# Patient Record
Sex: Female | Born: 1975 | Hispanic: Yes | Marital: Married | Smoking: Never smoker
Health system: Southern US, Community
[De-identification: ages and names within clinical notes are randomized; demographics above are authoritative.]

---

## 2015-08-21 ENCOUNTER — Ambulatory Visit
Admission: RE | Admit: 2015-08-21 | Discharge: 2015-08-21 | Disposition: A | Discharge status: Home / Self Care | Payer: No Typology Code available for payment source | Source: Ambulatory Visit | Attending: Surgery | Admitting: Surgery

## 2015-08-21 ENCOUNTER — Other Ambulatory Visit: Payer: Self-pay | Admitting: Surgery

## 2015-08-21 DIAGNOSIS — N63 Unspecified lump in unspecified breast: Secondary | ICD-10-CM

## 2015-08-24 ENCOUNTER — Encounter (INDEPENDENT_AMBULATORY_CARE_PROVIDER_SITE_OTHER): Payer: Self-pay

## 2015-08-24 ENCOUNTER — Encounter: Payer: Self-pay | Admitting: *Deleted

## 2015-08-24 ENCOUNTER — Ambulatory Visit: Payer: No Typology Code available for payment source | Attending: Internal Medicine | Admitting: *Deleted

## 2015-08-24 VITALS — BP 142/94 | HR 69 | Temp 98.4°F | Ht 60.63 in | Wt 140.4 lb

## 2015-08-24 DIAGNOSIS — N63 Unspecified lump in unspecified breast: Secondary | ICD-10-CM

## 2015-08-24 NOTE — Progress Notes (Signed)
Subjective:     Patient ID: Karen Peck, female   DOB: [birth year], 40 y.o.   MRN: [id number]  HPI   Review of Systems     Objective:   Physical Exam  Pulmonary/Chest: Right breast exhibits inverted nipple and mass. Right breast exhibits no nipple discharge, no skin change and no tenderness. Left breast exhibits inverted nipple and mass. Left breast exhibits no nipple discharge, no skin change and no tenderness. Breasts are asymmetrical.         Assessment:    40 year old Hispanic patient referred to [facility] by Dr. Renda Rolls for financial assistance for further evaluation of a right breast mass.  Kandis Cocking, the interpreter is present during the interview and exam.  Patient states she experienced pain in her right breast prior to her menstrual cycle on 08/04/15.  States the next day she noticed the right breast was firm.  States she went to her doctor, and was started on an antibiotic, and then sent to see Dr. Renda Rolls on 08/20/15.  States Dr. Katrinka Blazing did a needle aspiration.  Those results are not available today. Patient had a birads 4 mammogram on 08/21/15.  On clinical breast exam, I can visualize a large right breast mass at 1-3:00.  There is yellowish bruising noted.  There is also noted a small raised nodule at 1:00 left breast in the areola.  On palpation the right breast mass measures approximately 5X5 cm.   There is lymphadenopathy noted in the right axilla.  Palpable in the left breast is an approximate 0.5 cm firm nodule at 1:00 in the areola. Taught self breast awareness.  Patient has been screened for eligibility.  She does not have any insurance, Medicare or Medicaid.  She also meets financial eligibility.  Hand-out given on the Affordable Care Act.    Plan:     Called the [facility] care center to schedule patient for her biopsy.  Talked to Madelynn Done who was unable to schedule her today.  She is to call the patient and schedule her biopsy.  Patient informed to  call me if she has not heard from the Breast Center by tomorrow.  She is agreeable.  Will follow up per [facility] protocol.

## 2015-08-27 ENCOUNTER — Other Ambulatory Visit: Payer: Self-pay | Admitting: Surgery

## 2015-08-27 DIAGNOSIS — N6310 Unspecified lump in the right breast, unspecified quadrant: Secondary | ICD-10-CM

## 2015-09-01 ENCOUNTER — Ambulatory Visit
Admission: RE | Admit: 2015-09-01 | Discharge: 2015-09-01 | Disposition: A | Discharge status: Home / Self Care | Payer: Self-pay | Source: Ambulatory Visit | Attending: Surgery | Admitting: Surgery

## 2015-09-01 DIAGNOSIS — N63 Unspecified lump in unspecified breast: Secondary | ICD-10-CM

## 2015-09-01 DIAGNOSIS — N6310 Unspecified lump in the right breast, unspecified quadrant: Secondary | ICD-10-CM

## 2015-09-01 HISTORY — PX: BREAST BIOPSY: SHX20

## 2015-09-02 LAB — SURGICAL PATHOLOGY

## 2015-09-07 ENCOUNTER — Encounter: Payer: Self-pay | Admitting: *Deleted

## 2015-09-07 NOTE — Progress Notes (Signed)
Talked with Dr. Michaelle CopasSmith's nurse today to confirm that his office was referring the patient to infectious disease.  She states Dr. Katrinka BlazingSmith has already talked with Dr. Sampson GoonFitzgerald and their office was taking care of it.  Will close [facility] case since biopsy was benign.  [facility] to [place] [facility].

## 2015-11-09 ENCOUNTER — Other Ambulatory Visit: Payer: Self-pay | Admitting: Surgery

## 2015-11-09 DIAGNOSIS — N61 Mastitis without abscess: Secondary | ICD-10-CM

## 2015-11-23 ENCOUNTER — Ambulatory Visit
Admission: RE | Admit: 2015-11-23 | Discharge: 2015-11-23 | Disposition: A | Discharge status: Home / Self Care | Payer: No Typology Code available for payment source | Source: Ambulatory Visit | Attending: Surgery | Admitting: Surgery

## 2015-11-23 DIAGNOSIS — N61 Mastitis without abscess: Secondary | ICD-10-CM

## 2019-01-01 ENCOUNTER — Other Ambulatory Visit: Payer: Self-pay | Admitting: General Surgery

## 2019-01-01 DIAGNOSIS — N6310 Unspecified lump in the right breast, unspecified quadrant: Secondary | ICD-10-CM

## 2019-01-08 ENCOUNTER — Ambulatory Visit
Admission: RE | Admit: 2019-01-08 | Discharge: 2019-01-08 | Disposition: A | Discharge status: Home / Self Care | Payer: Self-pay | Source: Ambulatory Visit | Attending: General Surgery | Admitting: General Surgery

## 2019-01-08 ENCOUNTER — Encounter: Payer: Self-pay | Admitting: Radiology

## 2019-01-08 ENCOUNTER — Other Ambulatory Visit: Payer: Self-pay | Admitting: General Surgery

## 2019-01-08 ENCOUNTER — Ambulatory Visit
Admission: RE | Admit: 2019-01-08 | Discharge: 2019-01-08 | Disposition: A | Discharge status: Home / Self Care | Payer: No Typology Code available for payment source | Source: Ambulatory Visit | Attending: General Surgery | Admitting: General Surgery

## 2019-01-08 ENCOUNTER — Other Ambulatory Visit: Payer: Self-pay

## 2019-01-08 DIAGNOSIS — N6310 Unspecified lump in the right breast, unspecified quadrant: Secondary | ICD-10-CM

## 2019-01-09 ENCOUNTER — Telehealth: Payer: Self-pay | Admitting: *Deleted

## 2019-01-10 ENCOUNTER — Other Ambulatory Visit: Payer: Self-pay | Admitting: General Surgery

## 2019-01-10 DIAGNOSIS — N611 Abscess of the breast and nipple: Secondary | ICD-10-CM

## 2019-01-10 DIAGNOSIS — R928 Other abnormal and inconclusive findings on diagnostic imaging of breast: Secondary | ICD-10-CM

## 2019-01-11 LAB — BODY FLUID CULTURE

## 2019-01-15 ENCOUNTER — Ambulatory Visit
Admission: RE | Admit: 2019-01-15 | Discharge: 2019-01-15 | Disposition: A | Discharge status: Home / Self Care | Payer: No Typology Code available for payment source | Source: Ambulatory Visit | Attending: General Surgery | Admitting: General Surgery

## 2019-01-15 DIAGNOSIS — N611 Abscess of the breast and nipple: Secondary | ICD-10-CM

## 2019-01-15 DIAGNOSIS — R928 Other abnormal and inconclusive findings on diagnostic imaging of breast: Secondary | ICD-10-CM

## 2019-01-21 ENCOUNTER — Other Ambulatory Visit: Payer: Self-pay | Admitting: General Surgery

## 2019-01-21 DIAGNOSIS — N611 Abscess of the breast and nipple: Secondary | ICD-10-CM

## 2019-01-23 ENCOUNTER — Other Ambulatory Visit: Payer: Self-pay | Admitting: General Surgery

## 2019-01-23 ENCOUNTER — Other Ambulatory Visit: Payer: Self-pay

## 2019-01-23 ENCOUNTER — Ambulatory Visit
Admission: RE | Admit: 2019-01-23 | Discharge: 2019-01-23 | Disposition: A | Discharge status: Home / Self Care | Payer: No Typology Code available for payment source | Source: Ambulatory Visit | Attending: General Surgery | Admitting: General Surgery

## 2019-01-23 DIAGNOSIS — R928 Other abnormal and inconclusive findings on diagnostic imaging of breast: Secondary | ICD-10-CM | POA: Insufficient documentation

## 2019-01-23 DIAGNOSIS — N611 Abscess of the breast and nipple: Secondary | ICD-10-CM

## 2019-01-28 ENCOUNTER — Other Ambulatory Visit: Payer: Self-pay | Admitting: General Surgery

## 2019-01-28 DIAGNOSIS — N611 Abscess of the breast and nipple: Secondary | ICD-10-CM

## 2019-02-01 ENCOUNTER — Ambulatory Visit
Admission: RE | Admit: 2019-02-01 | Discharge: 2019-02-01 | Disposition: A | Discharge status: Home / Self Care | Payer: No Typology Code available for payment source | Source: Ambulatory Visit | Attending: General Surgery | Admitting: General Surgery

## 2019-02-01 DIAGNOSIS — N611 Abscess of the breast and nipple: Secondary | ICD-10-CM | POA: Insufficient documentation

## 2019-02-05 ENCOUNTER — Other Ambulatory Visit: Payer: Self-pay | Admitting: General Surgery

## 2019-02-05 DIAGNOSIS — N6310 Unspecified lump in the right breast, unspecified quadrant: Secondary | ICD-10-CM

## 2019-02-05 DIAGNOSIS — R928 Other abnormal and inconclusive findings on diagnostic imaging of breast: Secondary | ICD-10-CM

## 2019-02-12 ENCOUNTER — Ambulatory Visit
Admission: RE | Admit: 2019-02-12 | Discharge: 2019-02-12 | Disposition: A | Discharge status: Home / Self Care | Payer: Self-pay | Source: Ambulatory Visit | Attending: General Surgery | Admitting: General Surgery

## 2019-02-12 DIAGNOSIS — R928 Other abnormal and inconclusive findings on diagnostic imaging of breast: Secondary | ICD-10-CM

## 2019-02-12 DIAGNOSIS — N6310 Unspecified lump in the right breast, unspecified quadrant: Secondary | ICD-10-CM

## 2019-02-12 HISTORY — PX: BREAST BIOPSY: SHX20

## 2019-02-13 LAB — SURGICAL PATHOLOGY

## 2019-03-06 ENCOUNTER — Ambulatory Visit: Payer: Self-pay | Admitting: General Surgery

## 2019-03-06 NOTE — H&P (View-Only) (Signed)
HISTORY OF PRESENT ILLNESS:    Karen Peck is a 43 y.o.female patient who comes for evaluation of persistent palpable mass in the right breast.  Patient has a palpable mass for the last few months.  Mammogram and ultrasound was done showing no right breast abscess.  This was drained percutaneously multiple times without significant resolution.  She was treated with multiple courses of antibiotic therapy.  The infection itself has been under control but the palpable mass with pain has been persistent.  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  The patient reported the pain is localized to the palpable mass.  There is no pain radiation.  Aggravating factor is applying pressure to the area.  There is no alleviating factor.  There has been no fever or chills.  Is no nipple discharge.  Patient denies any family history of breast cancer.      PAST MEDICAL HISTORY:  History reviewed. No pertinent past medical history.      PAST SURGICAL HISTORY:   Past Surgical History:  Procedure Laterality Date  . CESAREAN SECTION  09/18/2006         MEDICATIONS:  No outpatient encounter medications on file as of 03/06/2019.   No facility-administered encounter medications on file as of 03/06/2019.      ALLERGIES:   Patient has no known allergies.   SOCIAL HISTORY:  Social History   Socioeconomic History  . Marital status: Married    Spouse name: Not on file  . Number of children: Not on file  . Years of education: Not on file  . Highest education level: Not on file  Occupational History  . Not on file  Social Needs  . Financial resource strain: Not on file  . Food insecurity    Worry: Not on file    Inability: Not on file  . Transportation needs    Medical: Not on file    Non-medical: Not on file  Tobacco Use  . Smoking status: Never Smoker  . Smokeless tobacco: Never Used  Substance and Sexual Activity  . Alcohol use: No   . Drug use: No  . Sexual activity: Defer  Other Topics Concern  . Not on file  Social History Narrative  . Not on file    FAMILY HISTORY:  Family History  Problem Relation Age of Onset  . No Known Problems Mother   . No Known Problems Father   . No Known Problems Sister   . No Known Problems Brother      GENERAL REVIEW OF SYSTEMS:   General ROS: negative for - chills, fatigue, fever, weight gain or weight loss Allergy and Immunology ROS: negative for - hives  Hematological and Lymphatic ROS: negative for - bleeding problems or bruising, negative for palpable nodes Endocrine ROS: negative for - heat or cold intolerance, hair changes Respiratory ROS: negative for - cough, shortness of breath or wheezing Cardiovascular ROS: no chest pain or palpitations GI ROS: negative for nausea, vomiting, abdominal pain, diarrhea, constipation Musculoskeletal ROS: negative for - joint swelling or muscle pain Neurological ROS: negative for - confusion, syncope Dermatological ROS: negative for pruritus and rash  PHYSICAL EXAM:  Vitals:   03/06/19 0832  BP: 126/82  Pulse: 71  .  Ht:152.4 cm (5') Wt:63.5 kg (140 lb) BSA:Body surface area is 1.64 meters squared. Body mass index is 27.34 kg/m..   GENERAL: Alert, active, oriented x3  HEENT: Pupils equal reactive to light.   Extraocular movements are intact. Sclera clear. Palpebral conjunctiva normal red color.Pharynx clear.  NECK: Supple with no palpable mass and no adenopathy.  LUNGS: Sound clear with no rales rhonchi or wheezes.  HEART: Regular rhythm S1 and S2 without murmur.  BREAST: There is a palpable mass at the 10 o'clock position of the right breast.  Mass is tender to palpation.  There is no axillary adenopathy.  There is no skin changes.  There is no nipple retraction.  ABDOMEN: Soft and depressible, nontender with no palpable mass, no hepatomegaly.   EXTREMITIES: Well-developed well-nourished symmetrical with no dependent  edema.  NEUROLOGICAL: Awake alert oriented, facial expression symmetrical, moving all extremities.      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 -He has been treated with multiple percutaneous drainage antibiotic courses. -Infection has been well controlled but a persistent mass is tender to palpation. -I discussed with the patient the alternative of excisional biopsy of the mass due to the persistent pain and recurring infections. -Patient agreed to proceed.  Benefit and risk of the surgery were discussed.  I discussed with the patient the risk of surgery including bleeding, infection, seroma, hematoma, pain, swelling, scarring and cosmetic issues.  Patient understood and agreed to proceed.          PLAN:  1. Excision of right breast palpable mass (19120) 2. Avoid taking aspirin 5 days before surgery 3. Contact us if has any question or concern.  Patient and her husband verbalized understanding, all questions were answered, and were agreeable with the plan outlined above.   Carolan Shiver, MD  Electronically signed by Carolan Shiver, MD

## 2019-03-06 NOTE — H&P (Signed)
HISTORY OF PRESENT ILLNESS:    [name] is a 44 y.o.female patient who comes for evaluation of persistent palpable mass in the right breast.  Patient has a palpable mass for the last few months.  Mammogram and ultrasound was done showing no right breast abscess.  This was drained percutaneously multiple times without significant resolution.  She was treated with multiple courses of antibiotic therapy.  The infection itself has been under control but the palpable mass with pain has been persistent.  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  The patient reported the pain is localized to the palpable mass.  There is no pain radiation.  Aggravating factor is applying pressure to the area.  There is no alleviating factor.  There has been no fever or chills.  Is no nipple discharge.  Patient denies any family history of breast cancer.      PAST MEDICAL HISTORY:  History reviewed. No pertinent past medical history.      PAST SURGICAL HISTORY:   Past Surgical History:  Procedure Laterality Date  . CESAREAN SECTION  09/18/2006         MEDICATIONS:  No outpatient encounter medications on file as of 03/06/2019.   No facility-administered encounter medications on file as of 03/06/2019.      ALLERGIES:   Patient has no known allergies.   SOCIAL HISTORY:  Social History   Socioeconomic History  . Marital status: Married    Spouse name: Not on file  . Number of children: Not on file  . Years of education: Not on file  . Highest education level: Not on file  Occupational History  . Not on file  Social Needs  . Financial resource strain: Not on file  . Food insecurity    Worry: Not on file    Inability: Not on file  . Transportation needs    Medical: Not on file    Non-medical: Not on file  Tobacco Use  . Smoking status: Never Smoker  . Smokeless tobacco: Never Used  Substance and Sexual Activity  . Alcohol use: No   . Drug use: No  . Sexual activity: Defer  Other Topics Concern  . Not on file  Social History Narrative  . Not on file    FAMILY HISTORY:  Family History  Problem Relation Age of Onset  . No Known Problems Mother   . No Known Problems Father   . No Known Problems Sister   . No Known Problems Brother      GENERAL REVIEW OF SYSTEMS:   General ROS: negative for - chills, fatigue, fever, weight gain or weight loss Allergy and Immunology ROS: negative for - hives  Hematological and Lymphatic ROS: negative for - bleeding problems or bruising, negative for palpable nodes Endocrine ROS: negative for - heat or cold intolerance, hair changes Respiratory ROS: negative for - cough, shortness of breath or wheezing Cardiovascular ROS: no chest pain or palpitations GI ROS: negative for nausea, vomiting, abdominal pain, diarrhea, constipation Musculoskeletal ROS: negative for - joint swelling or muscle pain Neurological ROS: negative for - confusion, syncope Dermatological ROS: negative for pruritus and rash  PHYSICAL EXAM:  Vitals:   03/06/19 0832  BP: 126/82  Pulse: 71  .  Ht:152.4 cm (5') Wt:63.5 kg (140 lb) ERD:EYCX surface area is 1.64 meters squared. Body mass index is 27.34 kg/m.Marland Kitchen   GENERAL: Alert, active, oriented x3  HEENT: Pupils equal reactive to light.  Extraocular movements are intact. Sclera clear. Palpebral conjunctiva normal red color.Pharynx clear.  NECK: Supple with no palpable mass and no adenopathy.  LUNGS: Sound clear with no rales rhonchi or wheezes.  HEART: Regular rhythm S1 and S2 without murmur.  BREAST: There is a palpable mass at the 10 o'clock position of the right breast.  Mass is tender to palpation.  There is no axillary adenopathy.  There is no skin changes.  There is no nipple retraction.  ABDOMEN: Soft and depressible, nontender with no palpable mass, no hepatomegaly.   EXTREMITIES: Well-developed well-nourished symmetrical with no dependent  edema.  NEUROLOGICAL: Awake alert oriented, facial expression symmetrical, moving all extremities.      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 -He has been treated with multiple percutaneous drainage antibiotic courses. -Infection has been well controlled but a persistent mass is tender to palpation. -I discussed with the patient the alternative of excisional biopsy of the mass due to the persistent pain and recurring infections. -Patient agreed to proceed.  Benefit and risk of the surgery were discussed.  I discussed with the patient the risk of surgery including bleeding, infection, seroma, hematoma, pain, swelling, scarring and cosmetic issues.  Patient understood and agreed to proceed.          PLAN:  1. Excision of right breast palpable mass (19120) 2. Avoid taking aspirin 5 days before surgery 3. Contact us if has any question or concern.  Patient and her husband verbalized understanding, all questions were answered, and were agreeable with the plan outlined above.   Carolan Shiver, MD  Electronically signed by Carolan Shiver, MD

## 2019-03-07 ENCOUNTER — Other Ambulatory Visit: Payer: Self-pay

## 2019-03-07 ENCOUNTER — Other Ambulatory Visit
Admission: RE | Admit: 2019-03-07 | Discharge: 2019-03-07 | Disposition: A | Discharge status: Home / Self Care | Payer: HRSA Program | Source: Ambulatory Visit | Attending: General Surgery | Admitting: General Surgery

## 2019-03-07 DIAGNOSIS — Z20822 Contact with and (suspected) exposure to covid-19: Secondary | ICD-10-CM | POA: Diagnosis not present

## 2019-03-07 DIAGNOSIS — Z01812 Encounter for preprocedural laboratory examination: Secondary | ICD-10-CM | POA: Diagnosis present

## 2019-03-07 LAB — SARS CORONAVIRUS 2 (TAT 6-24 HRS): SARS Coronavirus 2: NEGATIVE

## 2019-03-11 ENCOUNTER — Other Ambulatory Visit: Payer: Self-pay

## 2019-03-11 ENCOUNTER — Ambulatory Visit
Admission: RE | Admit: 2019-03-11 | Discharge: 2019-03-11 | Disposition: A | Discharge status: Home / Self Care | Payer: No Typology Code available for payment source | Attending: General Surgery | Admitting: General Surgery

## 2019-03-11 ENCOUNTER — Encounter: Payer: Self-pay | Admitting: General Surgery

## 2019-03-11 ENCOUNTER — Ambulatory Visit: Payer: No Typology Code available for payment source | Admitting: Anesthesiology

## 2019-03-11 ENCOUNTER — Encounter
Admission: RE | Disposition: A | Discharge status: Home / Self Care | Payer: Self-pay | Source: Home / Self Care | Attending: General Surgery

## 2019-03-11 DIAGNOSIS — N6121 Granulomatous mastitis, right breast: Secondary | ICD-10-CM | POA: Insufficient documentation

## 2019-03-11 DIAGNOSIS — K219 Gastro-esophageal reflux disease without esophagitis: Secondary | ICD-10-CM | POA: Insufficient documentation

## 2019-03-11 DIAGNOSIS — N644 Mastodynia: Secondary | ICD-10-CM | POA: Insufficient documentation

## 2019-03-11 HISTORY — PX: BREAST LUMPECTOMY: SHX2

## 2019-03-11 LAB — POCT PREGNANCY, URINE: Preg Test, Ur: NEGATIVE

## 2019-03-11 SURGERY — BREAST LUMPECTOMY
Anesthesia: General | Site: Breast | Laterality: Right

## 2019-03-11 MED ORDER — HYDROCODONE-ACETAMINOPHEN 5-325 MG PO TABS: 1.0000 | ORAL_TABLET | ORAL | 0 refills | Status: AC | PRN

## 2019-03-11 MED ORDER — ACETAMINOPHEN 10 MG/ML IV SOLN
INTRAVENOUS | Status: AC
  Filled 2019-03-11: qty 100

## 2019-03-11 MED ORDER — BUPIVACAINE-EPINEPHRINE (PF) 0.5% -1:200000 IJ SOLN
INTRAMUSCULAR | Status: DC | PRN
  Administered 2019-03-11: 30 mL

## 2019-03-11 MED ORDER — ROCURONIUM BROMIDE 50 MG/5ML IV SOLN
INTRAVENOUS | Status: AC
  Filled 2019-03-11: qty 2

## 2019-03-11 MED ORDER — MIDAZOLAM HCL 2 MG/2ML IJ SOLN
INTRAMUSCULAR | Status: AC
  Filled 2019-03-11: qty 2

## 2019-03-11 MED ORDER — FENTANYL CITRATE (PF) 100 MCG/2ML IJ SOLN
INTRAMUSCULAR | Status: DC | PRN
  Administered 2019-03-11 (×3): 25 ug via INTRAVENOUS
  Administered 2019-03-11: 50 ug via INTRAVENOUS
  Administered 2019-03-11: 25 ug via INTRAVENOUS

## 2019-03-11 MED ORDER — LACTATED RINGERS IV SOLN: INTRAVENOUS | Status: DC

## 2019-03-11 MED ORDER — SOD CITRATE-CITRIC ACID 500-334 MG/5ML PO SOLN
15.0000 mL | Freq: Once | ORAL | Status: AC
  Administered 2019-03-11: 15 mL via ORAL
  Filled 2019-03-11: qty 15

## 2019-03-11 MED ORDER — EPHEDRINE SULFATE 50 MG/ML IJ SOLN
INTRAMUSCULAR | Status: AC
  Filled 2019-03-11: qty 1

## 2019-03-11 MED ORDER — SCOPOLAMINE 1 MG/3DAYS TD PT72: 1.0000 | MEDICATED_PATCH | TRANSDERMAL | Status: DC

## 2019-03-11 MED ORDER — PHENYLEPHRINE HCL (PRESSORS) 10 MG/ML IV SOLN
INTRAVENOUS | Status: DC | PRN
  Administered 2019-03-11 (×5): 100 ug via INTRAVENOUS

## 2019-03-11 MED ORDER — ESMOLOL HCL 100 MG/10ML IV SOLN
INTRAVENOUS | Status: AC
  Filled 2019-03-11: qty 10

## 2019-03-11 MED ORDER — LACTATED RINGERS IV SOLN: INTRAVENOUS | Status: DC | PRN

## 2019-03-11 MED ORDER — ROCURONIUM BROMIDE 100 MG/10ML IV SOLN
INTRAVENOUS | Status: DC | PRN
  Administered 2019-03-11: 20 mg via INTRAVENOUS

## 2019-03-11 MED ORDER — PROPOFOL 10 MG/ML IV BOLUS
INTRAVENOUS | Status: AC
  Filled 2019-03-11: qty 20

## 2019-03-11 MED ORDER — CEFAZOLIN SODIUM-DEXTROSE 2-4 GM/100ML-% IV SOLN
2.0000 g | INTRAVENOUS | Status: AC
  Administered 2019-03-11: 2 g via INTRAVENOUS

## 2019-03-11 MED ORDER — SCOPOLAMINE 1 MG/3DAYS TD PT72
MEDICATED_PATCH | TRANSDERMAL | Status: AC
  Administered 2019-03-11: 1.5 mg via TRANSDERMAL
  Filled 2019-03-11: qty 1

## 2019-03-11 MED ORDER — FENTANYL CITRATE (PF) 100 MCG/2ML IJ SOLN
INTRAMUSCULAR | Status: AC
  Filled 2019-03-11: qty 2

## 2019-03-11 MED ORDER — GLYCOPYRROLATE 0.2 MG/ML IJ SOLN
INTRAMUSCULAR | Status: DC | PRN
  Administered 2019-03-11: .2 mg via INTRAVENOUS

## 2019-03-11 MED ORDER — OXYCODONE HCL 5 MG/5ML PO SOLN: 5.0000 mg | Freq: Once | ORAL | Status: DC | PRN

## 2019-03-11 MED ORDER — PHENYLEPHRINE HCL (PRESSORS) 10 MG/ML IV SOLN
INTRAVENOUS | Status: AC
  Filled 2019-03-11: qty 1

## 2019-03-11 MED ORDER — PROPOFOL 10 MG/ML IV BOLUS
INTRAVENOUS | Status: DC | PRN
  Administered 2019-03-11: 150 mg via INTRAVENOUS

## 2019-03-11 MED ORDER — MIDAZOLAM HCL 2 MG/2ML IJ SOLN
INTRAMUSCULAR | Status: DC | PRN
  Administered 2019-03-11: 2 mg via INTRAVENOUS

## 2019-03-11 MED ORDER — ONDANSETRON HCL 4 MG/2ML IJ SOLN
INTRAMUSCULAR | Status: DC | PRN
  Administered 2019-03-11 (×2): 4 mg via INTRAVENOUS

## 2019-03-11 MED ORDER — CEFAZOLIN SODIUM-DEXTROSE 2-4 GM/100ML-% IV SOLN
INTRAVENOUS | Status: AC
  Filled 2019-03-11: qty 100

## 2019-03-11 MED ORDER — OXYCODONE HCL 5 MG PO TABS: 5.0000 mg | ORAL_TABLET | Freq: Once | ORAL | Status: DC | PRN

## 2019-03-11 MED ORDER — SUCCINYLCHOLINE CHLORIDE 20 MG/ML IJ SOLN
INTRAMUSCULAR | Status: AC
  Filled 2019-03-11: qty 2

## 2019-03-11 MED ORDER — LIDOCAINE HCL (CARDIAC) PF 100 MG/5ML IV SOSY
PREFILLED_SYRINGE | INTRAVENOUS | Status: DC | PRN
  Administered 2019-03-11: 80 mg via INTRAVENOUS

## 2019-03-11 MED ORDER — LIDOCAINE HCL (PF) 2 % IJ SOLN
INTRAMUSCULAR | Status: AC
  Filled 2019-03-11: qty 5

## 2019-03-11 MED ORDER — ESMOLOL HCL 100 MG/10ML IV SOLN
INTRAVENOUS | Status: DC | PRN
  Administered 2019-03-11: 10 mg via INTRAVENOUS

## 2019-03-11 MED ORDER — SUGAMMADEX SODIUM 500 MG/5ML IV SOLN
INTRAVENOUS | Status: DC | PRN
  Administered 2019-03-11: 300 mg via INTRAVENOUS

## 2019-03-11 MED ORDER — GLYCOPYRROLATE 0.2 MG/ML IJ SOLN
INTRAMUSCULAR | Status: AC
  Filled 2019-03-11: qty 1

## 2019-03-11 MED ORDER — PROPOFOL 500 MG/50ML IV EMUL
INTRAVENOUS | Status: AC
  Filled 2019-03-11: qty 50

## 2019-03-11 MED ORDER — BUPIVACAINE-EPINEPHRINE (PF) 0.5% -1:200000 IJ SOLN
INTRAMUSCULAR | Status: AC
  Filled 2019-03-11: qty 30

## 2019-03-11 MED ORDER — SUCCINYLCHOLINE CHLORIDE 20 MG/ML IJ SOLN
INTRAMUSCULAR | Status: DC | PRN
  Administered 2019-03-11: 100 mg via INTRAVENOUS

## 2019-03-11 MED ORDER — SUGAMMADEX SODIUM 500 MG/5ML IV SOLN
INTRAVENOUS | Status: AC
  Filled 2019-03-11: qty 5

## 2019-03-11 MED ORDER — ONDANSETRON HCL 4 MG/2ML IJ SOLN
INTRAMUSCULAR | Status: AC
  Filled 2019-03-11: qty 4

## 2019-03-11 MED ORDER — FENTANYL CITRATE (PF) 100 MCG/2ML IJ SOLN: 25.0000 ug | INTRAMUSCULAR | Status: DC | PRN

## 2019-03-11 MED ORDER — DEXAMETHASONE SODIUM PHOSPHATE 10 MG/ML IJ SOLN
INTRAMUSCULAR | Status: DC | PRN
  Administered 2019-03-11: 10 mg via INTRAVENOUS

## 2019-03-11 MED ORDER — PROMETHAZINE HCL 25 MG/ML IJ SOLN: 6.2500 mg | INTRAMUSCULAR | Status: DC | PRN

## 2019-03-11 MED ORDER — ACETAMINOPHEN 10 MG/ML IV SOLN
INTRAVENOUS | Status: DC | PRN
  Administered 2019-03-11: 1000 mg via INTRAVENOUS

## 2019-03-11 SURGICAL SUPPLY — 43 items
BLADE SURG 15 STRL LF DISP TIS (BLADE) ×1 IMPLANT
BLADE SURG 15 STRL SS (BLADE) ×2
CANISTER SUCT 1200ML W/VALVE (MISCELLANEOUS) ×3 IMPLANT
CHLORAPREP W/TINT 26 (MISCELLANEOUS) ×3 IMPLANT
CNTNR SPEC 2.5X3XGRAD LEK (MISCELLANEOUS) ×1
CONT SPEC 4OZ STER OR WHT (MISCELLANEOUS) ×2
CONTAINER SPEC 2.5X3XGRAD LEK (MISCELLANEOUS) ×1 IMPLANT
COVER WAND RF STERILE (DRAPES) ×3 IMPLANT
DERMABOND ADVANCED (GAUZE/BANDAGES/DRESSINGS) ×2
DERMABOND ADVANCED .7 DNX12 (GAUZE/BANDAGES/DRESSINGS) ×1 IMPLANT
DEVICE DUBIN SPECIMEN MAMMOGRA (MISCELLANEOUS) ×3 IMPLANT
DRAPE LAPAROTOMY TRNSV 106X77 (MISCELLANEOUS) ×3 IMPLANT
ELECT CAUTERY BLADE TIP 2.5 (TIP) ×3
ELECT REM PT RETURN 9FT ADLT (ELECTROSURGICAL) ×3
ELECTRODE CAUTERY BLDE TIP 2.5 (TIP) ×1 IMPLANT
ELECTRODE REM PT RTRN 9FT ADLT (ELECTROSURGICAL) ×1 IMPLANT
GLOVE BIO SURGEON STRL SZ 6.5 (GLOVE) ×2 IMPLANT
GLOVE BIO SURGEONS STRL SZ 6.5 (GLOVE) ×1
GLOVE BIOGEL PI IND STRL 6.5 (GLOVE) ×1 IMPLANT
GLOVE BIOGEL PI INDICATOR 6.5 (GLOVE) ×2
GOWN STRL REUS W/ TWL LRG LVL3 (GOWN DISPOSABLE) ×3 IMPLANT
GOWN STRL REUS W/TWL LRG LVL3 (GOWN DISPOSABLE) ×6
KIT MARKER MARGIN INK (KITS) IMPLANT
KIT TURNOVER KIT A (KITS) ×3 IMPLANT
LABEL OR SOLS (LABEL) ×3 IMPLANT
MARGIN MAP 10MM (MISCELLANEOUS) ×5 IMPLANT
MARKER MARGIN CORRECT CLIP (MARKER) IMPLANT
NDL HYPO 25X1 1.5 SAFETY (NEEDLE) ×1 IMPLANT
NEEDLE HYPO 25X1 1.5 SAFETY (NEEDLE) ×3 IMPLANT
PACK BASIN MINOR ARMC (MISCELLANEOUS) ×3 IMPLANT
RETRACTOR RING XSMALL (MISCELLANEOUS) IMPLANT
RTRCTR WOUND ALEXIS 13CM XS SH (MISCELLANEOUS)
SUT ETHILON 3-0 FS-10 30 BLK (SUTURE) ×3
SUT MNCRL 4-0 (SUTURE) ×2
SUT MNCRL 4-0 27XMFL (SUTURE) ×1
SUT SILK 2 0 SH (SUTURE) ×3 IMPLANT
SUT VIC AB 3-0 SH 27 (SUTURE) ×4
SUT VIC AB 3-0 SH 27X BRD (SUTURE) ×1 IMPLANT
SUTURE EHLN 3-0 FS-10 30 BLK (SUTURE) ×1 IMPLANT
SUTURE MNCRL 4-0 27XMF (SUTURE) ×1 IMPLANT
SYR 10ML LL (SYRINGE) ×3 IMPLANT
SYR BULB IRRIG 60ML STRL (SYRINGE) ×3 IMPLANT
WATER STERILE IRR 1000ML POUR (IV SOLUTION) ×3 IMPLANT

## 2019-03-11 NOTE — Op Note (Signed)
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  Postoperative diagnosis: Right breast chronic granulomatous mastitis.   Procedure: Right breast excisional biopsy.                      Right breast re arrangement of tissue  Anesthesia: GETA  Surgeon: Dr. Hazle Quant  Wound Classification: Clean  Indications: Patient is a 44 y.o. female with a nonpalpable right breast multiple abscess with percutaneous drainage attempt. Biopsy showed granulomatous mastitis. Patient with large palpable mass that has been very painful.   Findings: 1. Large palpable mass on lateral aspect of right breast with chronic purulent fluid inside cavity.  2. No other palpable abnormality  Description of procedure:  The patient was taken to the operating room and placed supine on the operating table, and after general anesthesia the right chest was prepped and draped in the usual sterile fashion. A time-out was completed verifying correct patient, procedure, site, positioning, and implant(s) and/or special equipment prior to beginning this procedure.  A circumareolar skin incision was planned over palpable mass in such a way as to minimize the amount of dissection to reach the mass.  The skin incision was made. Flaps were raised and the location of the wire confirmed. The wire was delivered into the wound. A 2-0 silk figure-of-eight stay suture was placed around the wire and used for retraction. Dissection was then taken down circumferentially, taking care to include the entire mass. Tissue around with significant scar.  The specimen was removed. The specimen was oriented and sent to pathology.  The wound was irrigated. Hemostasis was checked.  The breast wound was then approached for closure.  Eliminate dead space a tissue transfer technique was utilized.  The breast and pectoralis fascia was elevated off the underlying muscle and the serratus muscle circumferentially for a distance of about 6-7  centimeters.  The fascial layer was then approximated with interrupted 3-0 Vicryl sutures.  The superficial layer of the breast parenchyma was then approximated in a similar fashion.  This was done in a radial direction.  The skin was closed with 4-0 Monocryl. Dermabond was applied. The patient tolerated the procedure well and was taken to the postanesthesia care unit in stable condition.   Specimen: Right Breast mass (Orientation markers used: Cranial,  Medial, Lateral, Deep)    Complications: None  Estimated Blood Loss: 5 mL

## 2019-03-11 NOTE — OR Nursing (Signed)
Discharge instructions given to pt. By interpreter. Pt. verbalized an understanding.

## 2019-03-11 NOTE — Anesthesia Preprocedure Evaluation (Addendum)
Anesthesia Evaluation  Patient identified by MRN, date of birth, ID band Patient awake    Reviewed: Allergy & Precautions, H&P , NPO status , Patient's Chart, lab work & pertinent test results  Airway Mallampati: I  TM Distance: >3 FB Neck ROM: full   Comment: 3+ tonsils Dental  (+) Teeth Intact   Pulmonary neg pulmonary ROS, neg shortness of breath, neg recent URI,           Cardiovascular (-) hypertension(-) anginanegative cardio ROS       Neuro/Psych negative neurological ROS  negative psych ROS   GI/Hepatic Neg liver ROS, GERD  Poorly Controlled,  Endo/Other  negative endocrine ROS  Renal/GU      Musculoskeletal   Abdominal   Peds  Hematology negative hematology ROS (+)   Anesthesia Other Findings History reviewed. No pertinent past medical history.  Past Surgical History: 09/01/2015: BREAST BIOPSY; Right     Comment:  benign, mastitis  02/12/2019: BREAST BIOPSY; Right     Comment:  venus clip [place] path pending     Reproductive/Obstetrics negative OB ROS                            Anesthesia Physical Anesthesia Plan  ASA: II  Anesthesia Plan: General ETT   Post-op Pain Management:    Induction:   PONV Risk Score and Plan: Dexamethasone, Ondansetron, Midazolam, Treatment may vary due to age or medical condition and Scopolamine patch - Pre-op  Airway Management Planned:   Additional Equipment:   Intra-op Plan:   Post-operative Plan:   Informed Consent: I have reviewed the patients History and Physical, chart, labs and discussed the procedure including the risks, benefits and alternatives for the proposed anesthesia with the patient or authorized representative who has indicated his/her understanding and acceptance.     Dental Advisory Given  Plan Discussed with: Anesthesiologist  Anesthesia Plan Comments:        Anesthesia Quick Evaluation

## 2019-03-11 NOTE — Transfer of Care (Signed)
Immediate Anesthesia Transfer of Care Note  Patient: Karen Peck  Procedure(s) Performed: BREAST LUMPECTOMY (Right Breast)  Patient Location: PACU  Anesthesia Type:General  Level of Consciousness: awake, alert , oriented and patient cooperative  Airway & Oxygen Therapy: Patient Spontanous Breathing and Patient connected to face mask oxygen  Post-op Assessment: Report given to RN and Post -op Vital signs reviewed and stable  Post vital signs: Reviewed and stable  Last Vitals:  Vitals Value Taken Time  BP 106/50 03/11/19 0909  Temp 36 C 03/11/19 0909  Pulse 73 03/11/19 0915  Resp 21 03/11/19 0915  SpO2 100 % 03/11/19 0915  Vitals shown include unvalidated device data.  Last Pain:  Vitals:   03/11/19 0909  TempSrc:   PainSc: Asleep         Complications: No apparent anesthesia complications

## 2019-03-11 NOTE — Anesthesia Postprocedure Evaluation (Signed)
Anesthesia Post Note  Patient: Geanna Divirgilio  Procedure(s) Performed: BREAST LUMPECTOMY (Right Breast)  Patient location during evaluation: PACU Anesthesia Type: General Level of consciousness: awake and alert Pain management: pain level controlled Vital Signs Assessment: post-procedure vital signs reviewed and stable Respiratory status: spontaneous breathing, nonlabored ventilation and respiratory function stable Cardiovascular status: blood pressure returned to baseline and stable Postop Assessment: no apparent nausea or vomiting Anesthetic complications: no     Last Vitals:  Vitals:   03/11/19 1003 03/11/19 1020  BP: (!) 107/50 (!) 110/54  Pulse: 86 85  Resp: 17 16  Temp: 36.5 C 36.4 C  SpO2: 100%     Last Pain:  Vitals:   03/11/19 1020  TempSrc:   PainSc: 0-No pain                 Karleen Hampshire

## 2019-03-11 NOTE — Discharge Instructions (Signed)
AMBULATORY SURGERY  DISCHARGE INSTRUCTIONS   1) The drugs that you were given will stay in your system until tomorrow so for the next 24 hours you should not:  A) Drive an automobile B) Make any legal decisions C) Drink any alcoholic beverage   2) You may resume regular meals tomorrow.  Today it is better to start with liquids and gradually work up to solid foods.  You may eat anything you prefer, but it is better to start with liquids, then soup and crackers, and gradually work up to solid foods.   3) Please notify your doctor immediately if you have any unusual bleeding, trouble breathing, redness and pain at the surgery site, drainage, fever, or pain not relieved by medication. 4)   5) Your post-operative visit with Dr.                                     is: Date:                        Time:    Please call to schedule your post-operative visit.  6) Additional Instructions:     Diet: Resume home heart healthy regular diet.   Activity: No heavy lifting >20 pounds (children, pets, laundry, garbage) or strenuous activity until follow-up, but light activity and walking are encouraged. Do not drive or drink alcohol if taking narcotic pain medications.  Wound care: May shower with soapy water and pat dry (do not rub incisions), but no baths or submerging incision underwater until follow-up. (no swimming)   Medications: Resume all home medications. For mild to moderate pain: acetaminophen (Tylenol) or ibuprofen (if no kidney disease). Combining Tylenol with alcohol can substantially increase your risk of causing liver disease. Narcotic pain medications, if prescribed, can be used for severe pain, though may cause nausea, constipation, and drowsiness. Do not combine Tylenol and Norco within a 6 hour period as Norco contains Tylenol. If you do not need the narcotic pain medication, you do not need to fill the prescription.  Call office ([phone]) at any time if any questions,  worsening pain, fevers/chills, bleeding, drainage from incision site, or other concerns.  

## 2019-03-11 NOTE — Anesthesia Procedure Notes (Signed)
Procedure Name: Intubation Performed by: Fletcher-Harrison, Kea Callan, CRNA Pre-anesthesia Checklist: Patient identified, Emergency Drugs available, Suction available and Patient being monitored Patient Re-evaluated:Patient Re-evaluated prior to induction Oxygen Delivery Method: Circle system utilized Preoxygenation: Pre-oxygenation with 100% oxygen Induction Type: IV induction Ventilation: Mask ventilation without difficulty Laryngoscope Size: McGraph and 3 Grade View: Grade I Tube type: Oral Tube size: 7.0 mm Number of attempts: 1 Airway Equipment and Method: Stylet Placement Confirmation: ETT inserted through vocal cords under direct vision,  positive ETCO2,  CO2 detector and breath sounds checked- equal and bilateral Secured at: 20 cm Tube secured with: Tape Dental Injury: Teeth and Oropharynx as per pre-operative assessment        

## 2019-03-11 NOTE — Interval H&P Note (Signed)
History and Physical Interval Note:  03/11/2019 7:03 AM  Karen Peck  has presented today for surgery, with the diagnosis of N60.11 Mastitis chronic, Rt.  The various methods of treatment have been discussed with the patient and family. After consideration of risks, benefits and other options for treatment, the patient has consented to  Procedure(s): BREAST LUMPECTOMY (Right) as a surgical intervention.  The patient's history has been reviewed, patient examined, no change in status, stable for surgery.  I have reviewed the patient's chart and labs.  Right breast marked in the pre procedure room. Questions were answered to the patient's satisfaction.     Carolan Shiver

## 2019-03-13 LAB — SURGICAL PATHOLOGY

## 2019-04-08 ENCOUNTER — Telehealth: Payer: Self-pay | Admitting: General Surgery

## 2019-04-08 NOTE — Telephone Encounter (Signed)
Received call from answering service regarding this patient and drainage from breast.  I spoke with the patient's daughter.  The drainage is clear yellow with a slight tinge of blood.  No pus. No fevers or redness.  Based on our discussion, it sounds most consistent with a seroma that has self-expressed via her incision site.  Advised them to keep clean, use a dry gauze dressing to catch any additional fluid, and to contact Dr. Vedia Coffer office in the morning.

## 2020-04-16 IMAGING — US US BREAST*R* LIMITED INC AXILLA
1 series · 12 of 12 positions shown · non-contrast
Comparison: Previous exam(s).

CLINICAL DATA: 43-year-old patient presents for follow-up of a
probable right breast abscess. The palpable area in the 10 o'clock
position the right breast persists. The patient denies any skin
erythema or nipple discharge. She is currently taking a course of
Augmentin, which was prescribed on January 23, 2019. Prior to that,
she was treated with doxycycline. She has undergone a total of 3
ultrasound-guided aspirations of the right breast. A Spanish
interpreter was present throughout the exam.

EXAM:
ULTRASOUND OF THE RIGHT BREAST

[Series 1: us breast*right* limited inc axilla · 0.06mm/px · 12 of 12 slices shown]
[im 1/12]
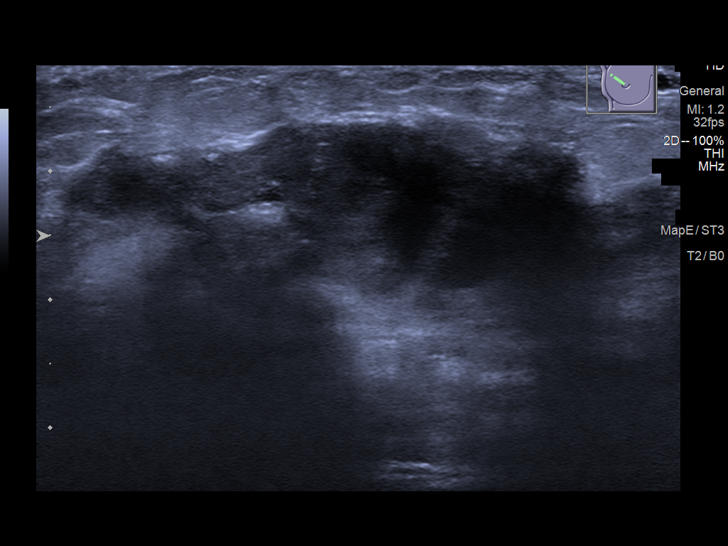
[im 2/12]
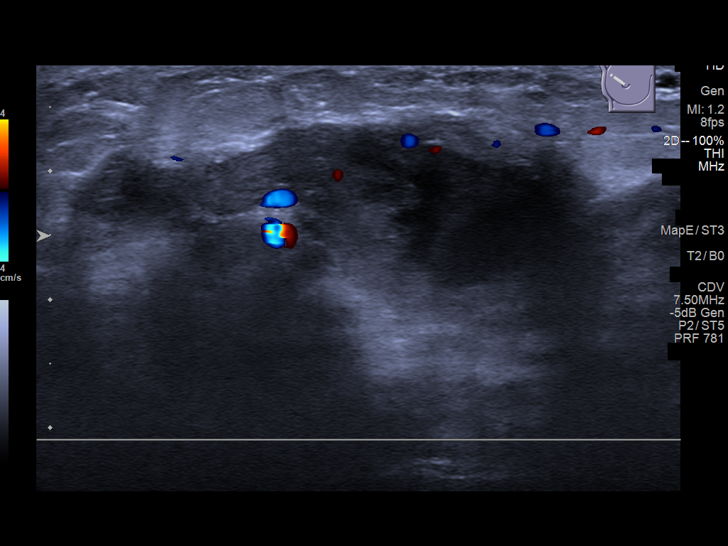
[im 3/12]
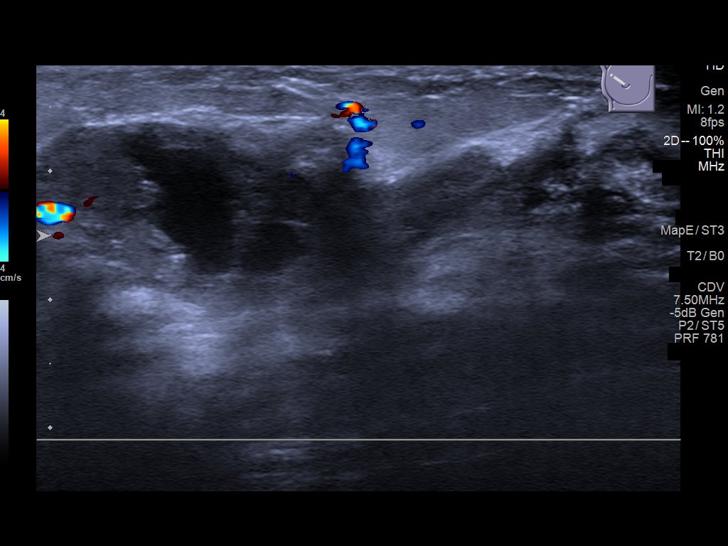
[im 4/12]
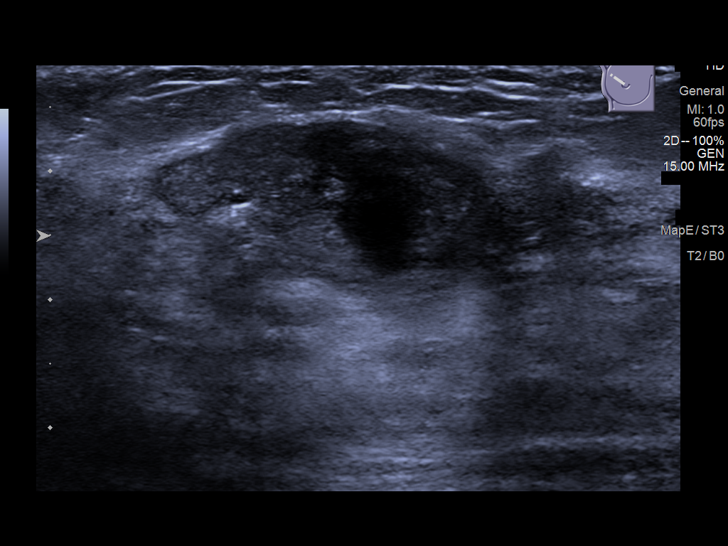
[im 5/12]
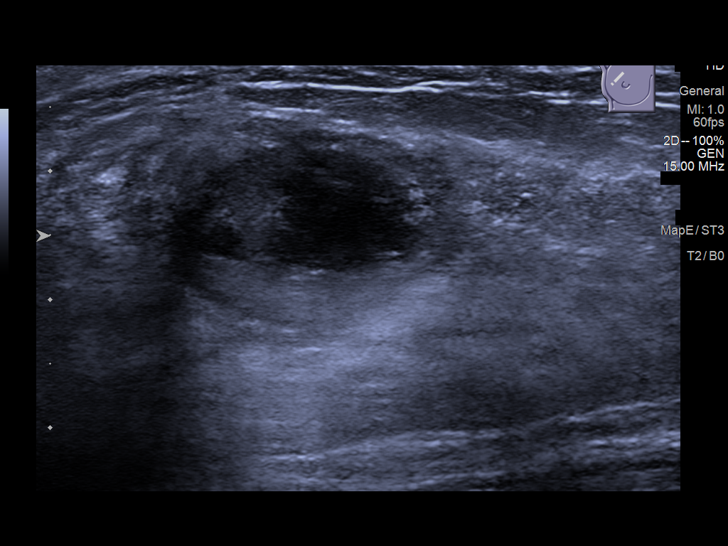
[im 6/12]
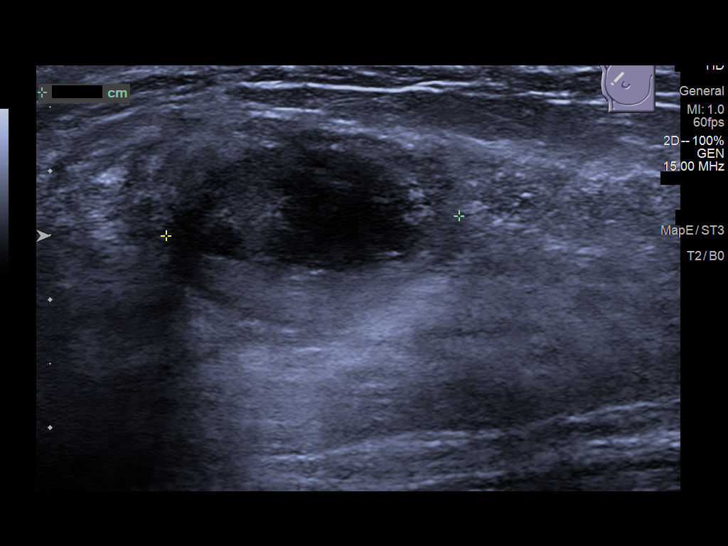
[im 7/12]
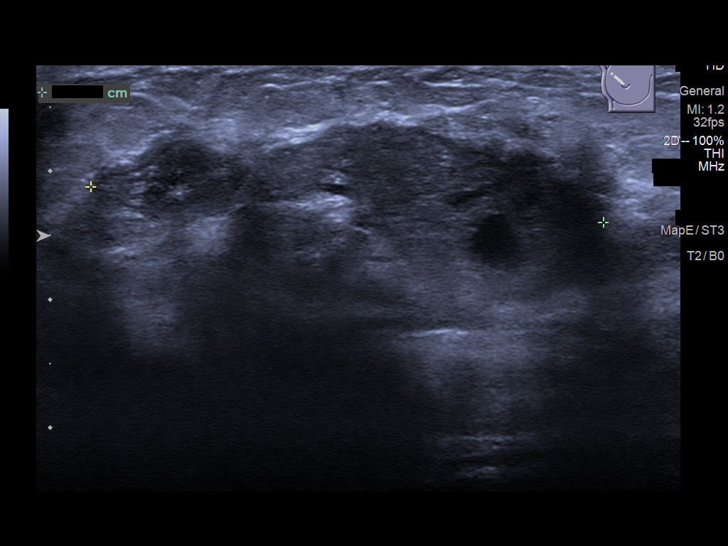
[im 8/12]
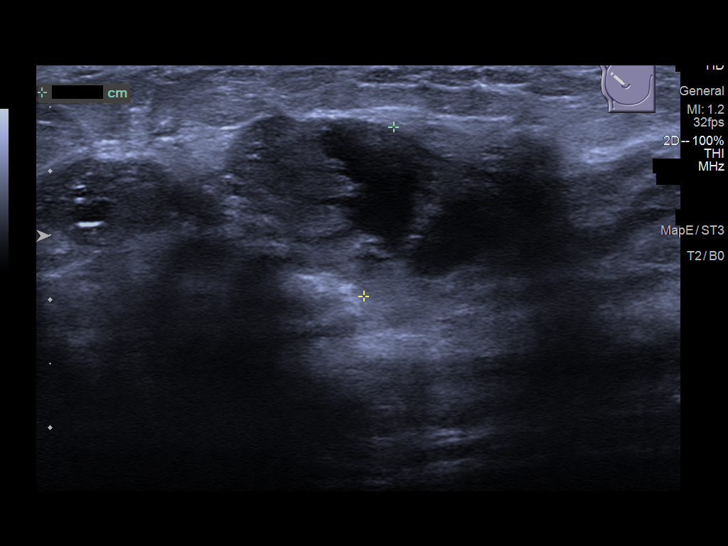
[im 9/12]
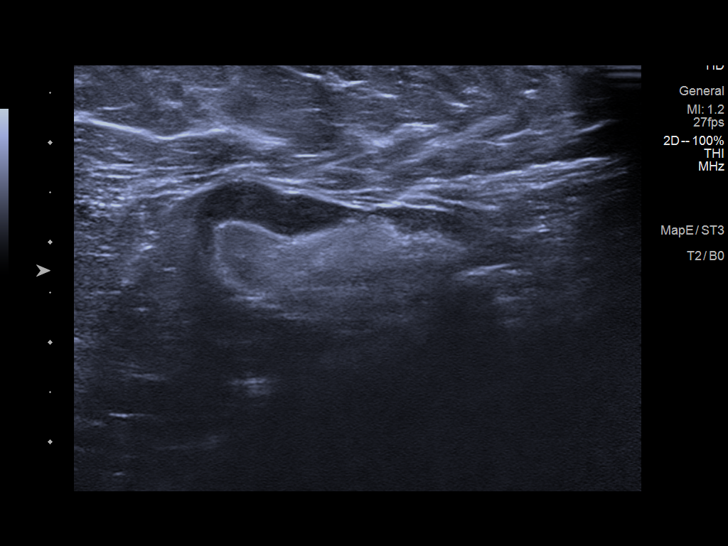
[im 10/12]
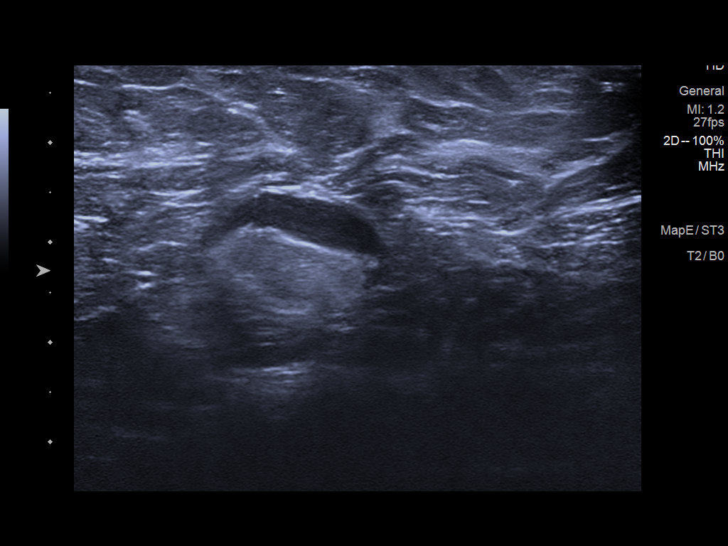
[im 11/12]
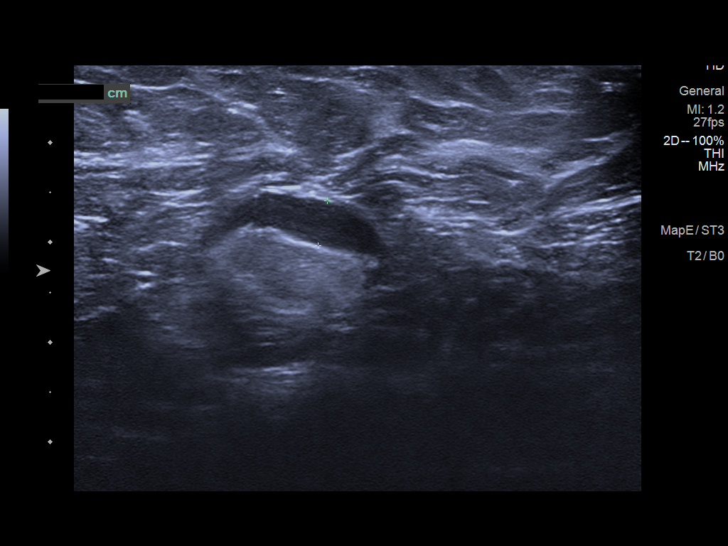
[im 12/12]
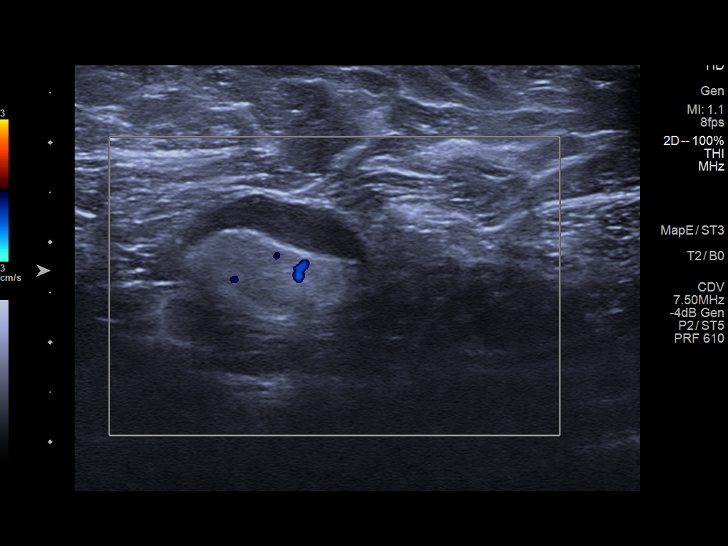

[12 of 12 positions shown; findings below may reference images not displayed]

FINDINGS: On physical exam, there is an approximately 4-5 cm palpable mass in
the 10 o'clock position of the right breast centered 6 cm from the
nipple. The overlying skin appears normal.

Targeted ultrasound is performed, showing heterogeneously hypoechoic
irregular mass with internal anechoic areas. In total, the
hypoechoic mass measures 4.0 x 1.3 x 2.3 cm. Previously, the mass
measured 2.4 x 1.3 x 2.7 cm on January 23, 2019. There is some
peripheral vascularity. Overlying skin thickness is mildly
prominent, measuring 1.3 mm. Ultrasound of the right axilla
demonstrates a lymph node with a normal fatty hilum and a mildly
thickened cortex, of 4.6 mm. The cortex of this thickness would not
be unexpected in the setting of a breast infection.
IMPRESSION: Persistent and slightly more prominent mass with central fluid in
the 10 o'clock position of the right breast as described above.

Probable reactive right axillary lymph node.

RECOMMENDATION:
Ultrasound-guided core needle biopsy of the right breast mass is
recommended, and was suggested on Dr. Feuer report of January 23, 2019, if today's results showed a persistent mass. Biopsy will
be scheduled for the patient.

Additionally, I recommend a follow-up clinic appointment with Dr.
Central after biopsy results are available.

If pathology of the right breast mass biopsy shows of malignancy,
then an ultrasound-guided biopsy of the right axillary node should
be scheduled.

I have discussed the findings and recommendations with the patient.
If applicable, a reminder letter will be sent to the patient
regarding the next appointment.

BI-RADS CATEGORY  4: Suspicious.

## 2020-04-27 IMAGING — MG US  BREAST BX W/ LOC DEV 1ST LESION IMG BX SPEC US GUIDE*R*
1 series · 8 of 8 positions shown · non-contrast
Comparison: Previous exam(s).
COMPARISON: Previous exam(s).

Addendum:
CLINICAL DATA: 43-year-old female presenting for ultrasound-guided
biopsy of a mass presumed to be an abscess in the upper-outer right
breast. The patient originally presented to breast imaging
01/08/2019, and has had a total of 3 aspirations since that time.
She has been prescribed amoxicillin by her physician, and states
this is the only antibiotic she has taken (she was prescribed
doxycycline by breast imaging in [REDACTED], however this prescription
was not filled). The the problem in her right breast has not
resolved, and therefore ultrasound-guided biopsy was recommended.
Following the biopsy today, I prescribed her a course of Bactrim ds
twice a day for 10 days.

EXAM:
ULTRASOUND GUIDED RIGHT BREAST CORE NEEDLE BIOPSY

[Series 1: MG view · 0.06mm/px · 8 of 9 slices shown]
[im 1/9]
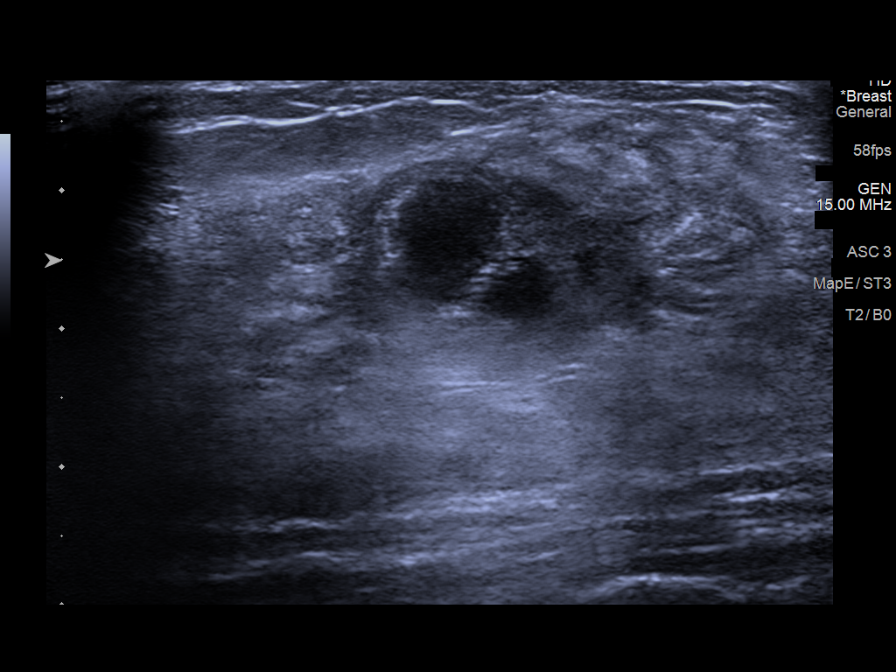
[im 2/9]
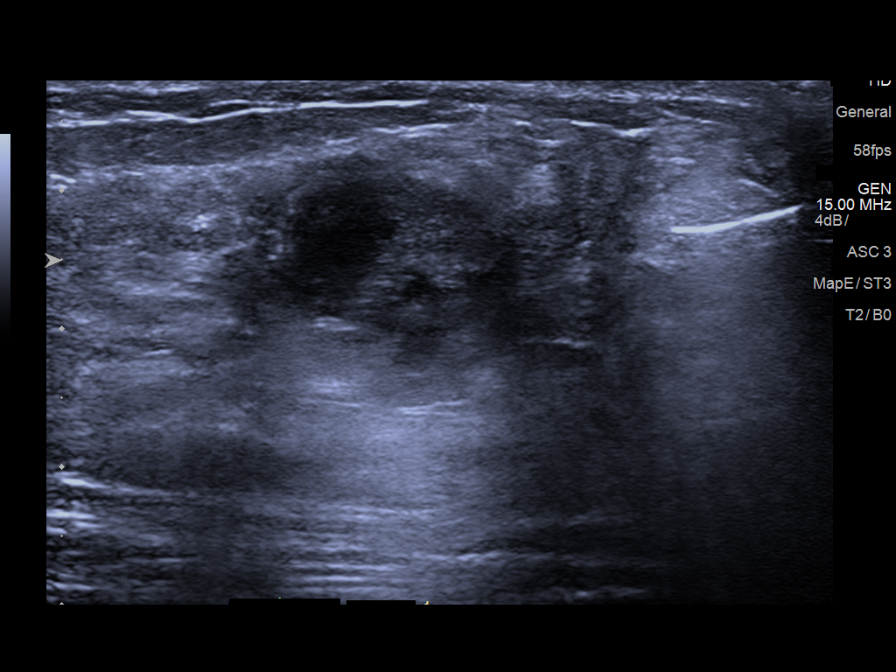
[im 3/9]
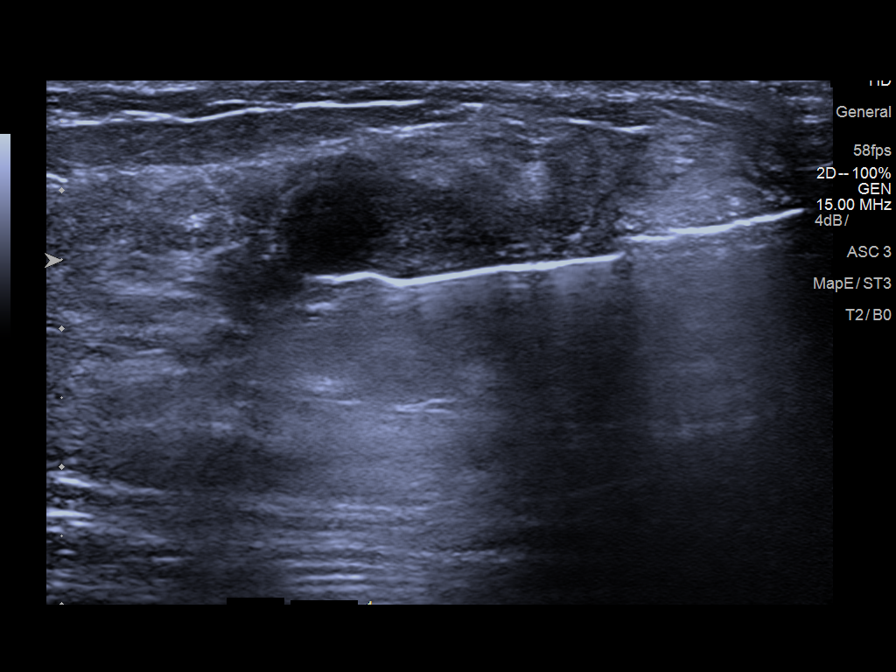
[im 4/9]
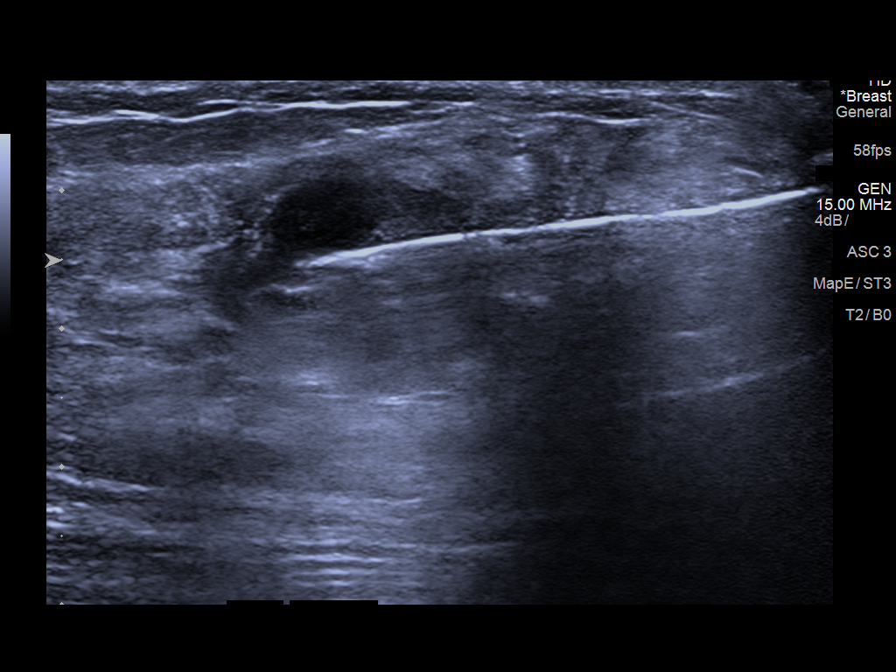
[im 5/9]
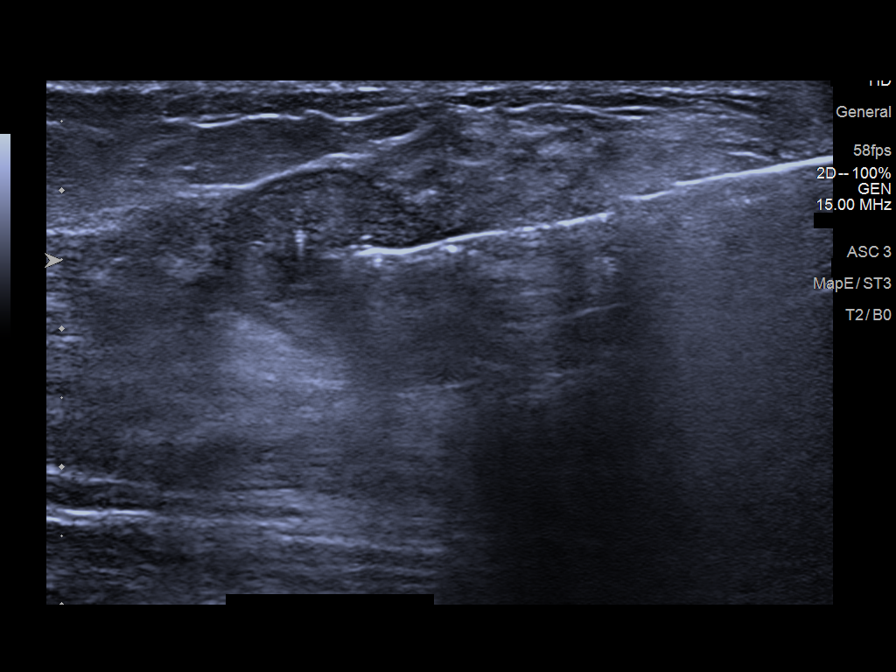
[im 6/9]
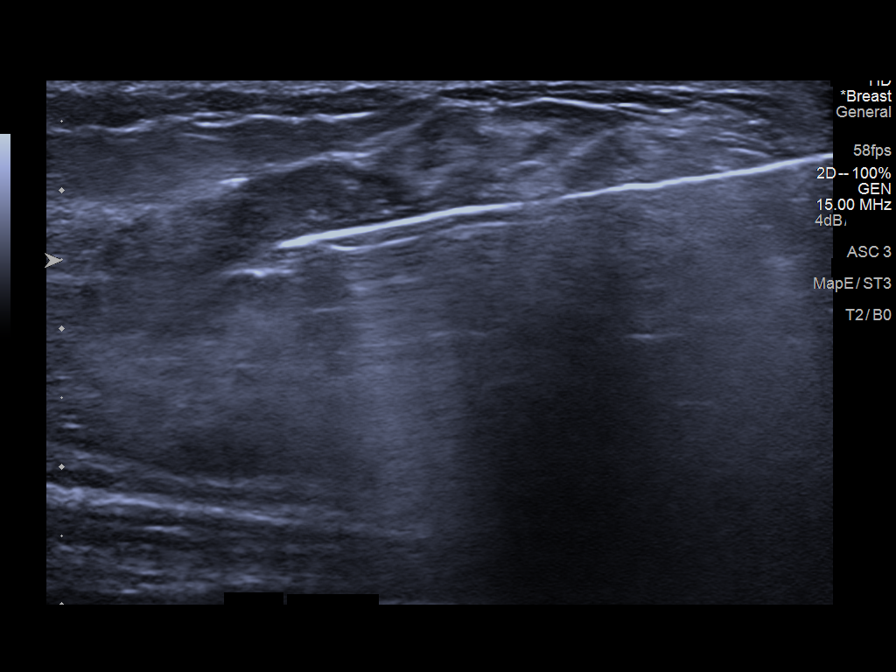
[im 7/9]
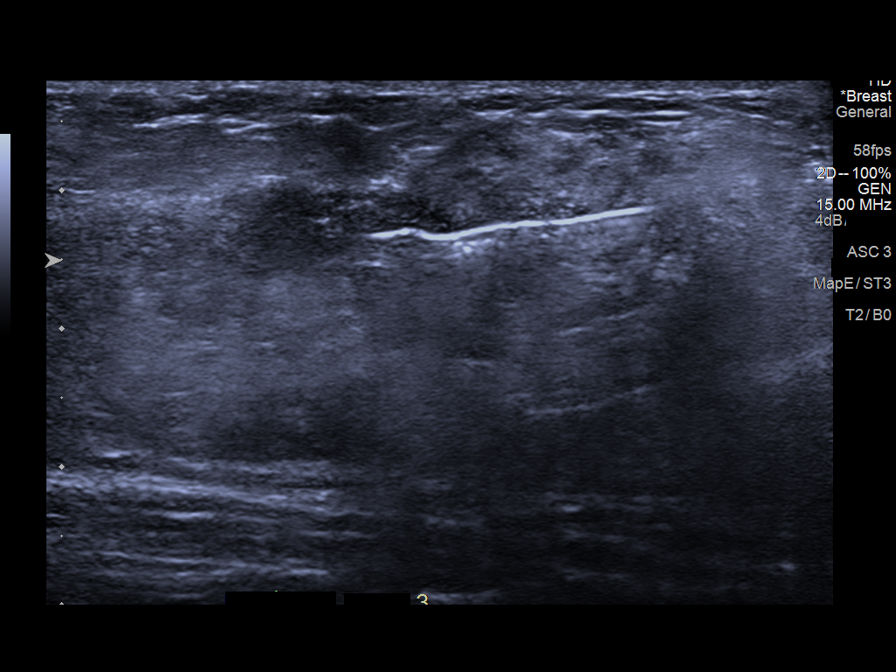
[im 9/9]
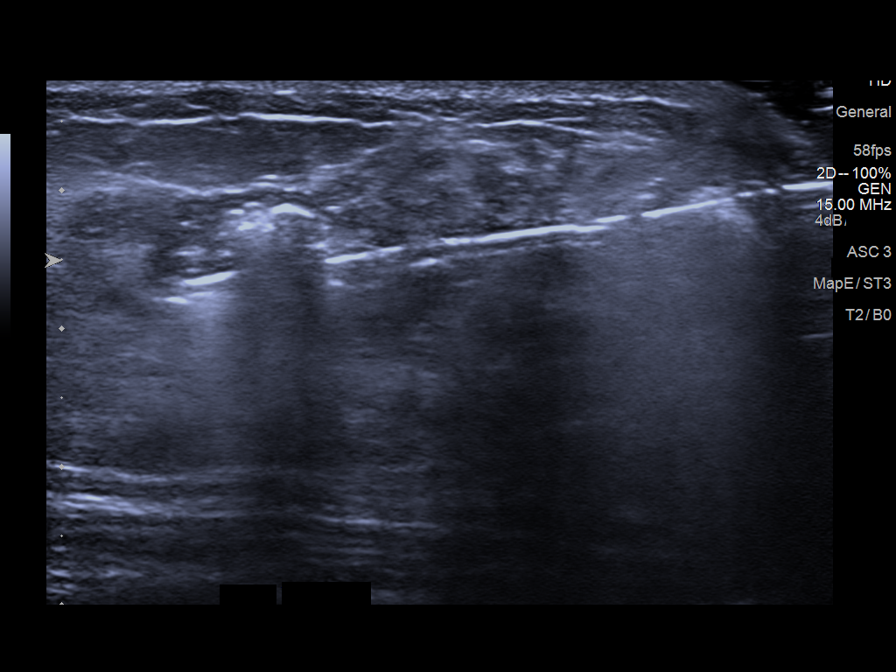

[8 of 8 positions shown; findings below may reference images not displayed]



Lesion quadrant: Upper outer quadrant

Using sterile technique and 1% Lidocaine as local anesthetic, under
direct ultrasound visualization, a 14 gauge Suri device was
used to perform biopsy of the right breast mass at 10 o'clock using
an inferior approach. At the conclusion of the procedure venous
shaped tissue marker clip was deployed into the biopsy cavity.
Follow up 2 view mammogram was performed and dictated separately.
IMPRESSION: Ultrasound guided biopsy of the right breast mass, presumed abscess
in the upper-outer right breast. No apparent complications.

ADDENDUM:
PATHOLOGY revealed: A. RIGHT BREAST, [DATE]; ULTRASOUND-GUIDED NEEDLE
CORE BIOPSY: - CYSTIC NEUTROPHILIC GRANULOMATOUS MASTITIS.

Comment: There is an inflammatory infiltrate consisting of
lymphocytes, plasma cells, neutrophils, and macrophages including
multinucleated giant cells. Scattered within the infiltrate are
cystic vacuoles lined by neutrophils and multinucleated giant cells.
The findings are similar to those seen in the previous biopsy
([id number]). There is no evidence of atypia or malignancy.

Pathology results are CONCORDANT with imaging findings, per Dr.
Itsyaboii Gomez.

Pathology results and recommendations were discussed with patient's
representative and husband (Mady) as interpreter by telephone on
02/15/2019. Patient's biopsy site doing well yet remains moderately
painful. Patient remains on antibiotic therapy as prescribed.
Patient has not received notice from provider's office for follow-up
appointment. Post biopsy care instructions were reviewed and
questions were answered. Patient was instructed to call [HOSPITAL]
[REDACTED] if any concerns or questions arise related to the
biopsy.

Recommendation: Surgical consultation for excision is recommended.
Attempts to contact provider's nurse via telephone and email were
unsuccessful. I instructed patient's husband (Mady) to call
surgeon (Dr. Jose Tripleta Pellicier) [REDACTED] (02/18/2019) to schedule
follow-up appointment for patient. The patient should remain on her
antibiotic therapy.

Addendum by Cafar Domurcuk RN on 02/15/2019.



Lesion quadrant: Upper outer quadrant

Using sterile technique and 1% Lidocaine as local anesthetic, under
direct ultrasound visualization, a 14 gauge Suri device was
used to perform biopsy of the right breast mass at 10 o'clock using
an inferior approach. At the conclusion of the procedure venous
shaped tissue marker clip was deployed into the biopsy cavity.
Follow up 2 view mammogram was performed and dictated separately.
IMPRESSION: Ultrasound guided biopsy of the right breast mass, presumed abscess
in the upper-outer right breast. No apparent complications.

## 2020-07-17 ENCOUNTER — Other Ambulatory Visit: Payer: Self-pay | Admitting: Student

## 2020-07-17 DIAGNOSIS — N926 Irregular menstruation, unspecified: Secondary | ICD-10-CM

## 2020-07-17 DIAGNOSIS — R140 Abdominal distension (gaseous): Secondary | ICD-10-CM

## 2020-08-04 ENCOUNTER — Ambulatory Visit: Payer: Self-pay

## 2020-10-21 ENCOUNTER — Encounter: Payer: Self-pay | Admitting: General Surgery
